# Patient Record
Sex: Female | Born: 1950 | Hispanic: No | Smoking: Never smoker
Health system: Southern US, Community
[De-identification: ages and names within clinical notes are randomized; demographics above are authoritative.]

---

## 2017-04-06 ENCOUNTER — Ambulatory Visit (INDEPENDENT_AMBULATORY_CARE_PROVIDER_SITE_OTHER): Payer: Self-pay | Admitting: Physician Assistant

## 2017-04-06 ENCOUNTER — Encounter (INDEPENDENT_AMBULATORY_CARE_PROVIDER_SITE_OTHER): Payer: Self-pay | Admitting: Physician Assistant

## 2017-04-06 VITALS — BP 132/73 | HR 74 | Temp 98.5°F | Ht <= 58 in | Wt 191.2 lb

## 2017-04-06 DIAGNOSIS — M5442 Lumbago with sciatica, left side: Secondary | ICD-10-CM

## 2017-04-06 DIAGNOSIS — R1013 Epigastric pain: Secondary | ICD-10-CM

## 2017-04-06 DIAGNOSIS — R7303 Prediabetes: Secondary | ICD-10-CM

## 2017-04-06 DIAGNOSIS — Z131 Encounter for screening for diabetes mellitus: Secondary | ICD-10-CM

## 2017-04-06 DIAGNOSIS — M5441 Lumbago with sciatica, right side: Secondary | ICD-10-CM

## 2017-04-06 DIAGNOSIS — G8929 Other chronic pain: Secondary | ICD-10-CM

## 2017-04-06 LAB — POCT GLYCOSYLATED HEMOGLOBIN (HGB A1C): HEMOGLOBIN A1C: 6.3

## 2017-04-06 MED ORDER — OMEPRAZOLE 40 MG PO CPDR: 40.0000 mg | DELAYED_RELEASE_CAPSULE | Freq: Every day | ORAL | 3 refills | Status: DC

## 2017-04-06 MED ORDER — NAPROXEN 500 MG PO TABS: 500.0000 mg | ORAL_TABLET | Freq: Two times a day (BID) | ORAL | 0 refills | Status: DC

## 2017-04-06 MED FILL — OMEPRAZOLE DR 40 MG CAPSULE: 40 | 30 days supply | Qty: 30 | Fill #0

## 2017-04-06 MED FILL — NAPROXEN 500 MG TABLET: 500 | 15 days supply | Qty: 30 | Fill #0

## 2017-04-06 NOTE — Progress Notes (Signed)
Subjective:  Patient ID: Shelley Parker, female    DOB: 12-Jan-1951  Age: 66 y.o. MRN: [id number]  CC: abdominal pain, joint pain, nausea  HPI Shelley Parker is a 66 y.o. female with no significant medical history presents as a new patient with complaint of abdominal pain, joint pain, and nausea. Epigastric pain and bloating since approximately one year ago. Medication was given in [place] that alleviated the pain but she does not know the name medication. Does not endorse melena, hematemesis, BRBPR, f/c/n/v, CP, palpitations, SOB, or HA.       Joint pain in the lower back, knees, hips, and elbows. Pain worse at the lower back. Back pain worse with prolonged sitting and rest. Pain radiates down both legs. Radiculopathy described as partial numbness with associated weakness.  Does not endorse injury, repetitive use, edema, erythema, rash, urinary incontinence, or fecal incontinence.   * Daughter serves as the interpreter for the visit.    ROS Review of Systems  Constitutional: Negative for chills, fever and malaise/fatigue.  Eyes: Negative for blurred vision.  Respiratory: Negative for shortness of breath.   Cardiovascular: Negative for chest pain, palpitations and leg swelling.  Gastrointestinal: Positive for abdominal pain, constipation and nausea. Negative for blood in stool, diarrhea and melena.  Genitourinary: Negative for dysuria and hematuria.  Musculoskeletal: Positive for joint pain. Negative for myalgias.  Skin: Negative for rash.  Neurological: Negative for tingling and headaches.  Psychiatric/Behavioral: Negative for depression. The patient is not nervous/anxious.     Objective:  BP 132/73 (BP Location: Right Arm, Patient Position: Sitting, Cuff Size: Large)   Pulse 74   Temp 98.5 F (36.9 C) (Oral)   Ht 4' 9.09" (1.45 m)   Wt 191 lb 3.2 oz (86.7 kg)   SpO2 99%   BMI 41.25 kg/m   BP/Weight 04/06/2017  Systolic BP 132  Diastolic BP 73  Wt. (Lbs) 191.2  BMI 41.25       Physical Exam  Constitutional: She is oriented to person, place, and time.  Well developed, overweight, NAD, polite  HENT:  Head: Normocephalic and atraumatic.  Eyes: Conjunctivae are normal. No scleral icterus.  Neck: Normal range of motion. Neck supple. No thyromegaly present.  Cardiovascular: Normal rate, regular rhythm and normal heart sounds.   Trace pitting edema of the LLE  Pulmonary/Chest: Effort normal and breath sounds normal. No respiratory distress. She has no wheezes.  Abdominal: Soft. Bowel sounds are normal. There is tenderness (mild epigastric TTP).  Musculoskeletal: She exhibits no edema.  Neurological: She is alert and oriented to person, place, and time. No cranial nerve deficit. Coordination normal.  Skin: Skin is warm and dry. No rash noted. No erythema. No pallor.  Psychiatric: She has a normal mood and affect. Her behavior is normal. Thought content normal.  Vitals reviewed.    Assessment & Plan:   1. Abdominal pain, chronic, epigastric - H. pylori antibody, IgG - Comprehensive metabolic panel - CBC with Differential - Begin omeprazole (PRILOSEC) 40 MG capsule; Take 1 capsule (40 mg total) by mouth daily.  Dispense: 30 capsule; Refill: 3  2. Chronic bilateral low back pain with bilateral sciatica - DG Lumbar Spine Complete; Future - Begin naproxen (NAPROSYN) 500 MG tablet; Take 1 tablet (500 mg total) by mouth 2 (two) times daily with a meal.  Dispense: 30 tablet; Refill: 0  3. Prediabetes - HgB A1c 6.3% in clinic today.  4. Screening for diabetes mellitus - HgB A1c 6.3% in clinic today.   Meds  ordered this encounter  Medications  . omeprazole (PRILOSEC) 40 MG capsule    Sig: Take 1 capsule (40 mg total) by mouth daily.    Dispense:  30 capsule    Refill:  3    Order Specific Question:   Supervising Provider    Answer:   Quentin Angst [id number]  . naproxen (NAPROSYN) 500 MG tablet    Sig: Take 1 tablet (500 mg total) by mouth 2  (two) times daily with a meal.    Dispense:  30 tablet    Refill:  0    Order Specific Question:   Supervising Provider    Answer:   Quentin Angst [id number]    Follow-up: Return in about 3 weeks (around 04/27/2017) for back pain.   Loletta Specter PA

## 2017-04-06 NOTE — Patient Instructions (Signed)
Prediabetes Eating Plan Prediabetes-also called impaired glucose tolerance or impaired fasting glucose-is a condition that causes blood sugar (blood glucose) levels to be higher than normal. Following a healthy diet can help to keep prediabetes under control. It can also help to lower the risk of type 2 diabetes and heart disease, which are increased in people who have prediabetes. Along with regular exercise, a healthy diet:  Promotes weight loss.  Helps to control blood sugar levels.  Helps to improve the way that the body uses insulin.  What do I need to know about this eating plan?  Use the glycemic index (GI) to plan your meals. The index tells you how quickly a food will raise your blood sugar. Choose low-GI foods. These foods take a longer time to raise blood sugar.  Pay close attention to the amount of carbohydrates in the food that you eat. Carbohydrates increase blood sugar levels.  Keep track of how many calories you take in. Eating the right amount of calories will help you to achieve a healthy weight. Losing about 7 percent of your starting weight can help to prevent type 2 diabetes.  You may want to follow a Mediterranean diet. This diet includes a lot of vegetables, lean meats or fish, whole grains, fruits, and healthy oils and fats. What foods can I eat? Grains Whole grains, such as whole-wheat or whole-grain breads, crackers, cereals, and pasta. Unsweetened oatmeal. Bulgur. Barley. Quinoa. Brown rice. Corn or whole-wheat flour tortillas or taco shells. Vegetables Lettuce. Spinach. Peas. Beets. Cauliflower. Cabbage. Broccoli. Carrots. Tomatoes. Squash. Eggplant. Herbs. Peppers. Onions. Cucumbers. Brussels sprouts. Fruits Berries. Bananas. Apples. Oranges. Grapes. Papaya. Mango. Pomegranate. Kiwi. Grapefruit. Cherries. Meats and Other Protein Sources Seafood. Lean meats, such as chicken and turkey or lean cuts of pork and beef. Tofu. Eggs. Nuts. Beans. Dairy Low-fat or  fat-free dairy products, such as yogurt, cottage cheese, and cheese. Beverages Water. Tea. Coffee. Sugar-free or diet soda. Seltzer water. Milk. Milk alternatives, such as soy or almond milk. Condiments Mustard. Relish. Low-fat, low-sugar ketchup. Low-fat, low-sugar barbecue sauce. Low-fat or fat-free mayonnaise. Sweets and Desserts Sugar-free or low-fat pudding. Sugar-free or low-fat ice cream and other frozen treats. Fats and Oils Avocado. Walnuts. Olive oil. The items listed above may not be a complete list of recommended foods or beverages. Contact your dietitian for more options. What foods are not recommended? Grains Refined white flour and flour products, such as bread, pasta, snack foods, and cereals. Beverages Sweetened drinks, such as sweet iced tea and soda. Sweets and Desserts Baked goods, such as cake, cupcakes, pastries, cookies, and cheesecake. The items listed above may not be a complete list of foods and beverages to avoid. Contact your dietitian for more information. This information is not intended to replace advice given to you by your health care provider. Make sure you discuss any questions you have with your health care provider. Document Released: 10/16/2014 Document Revised: 11/07/2015 Document Reviewed: 06/27/2014 Elsevier Interactive Patient Education  2017 Elsevier Inc.  

## 2017-04-07 ENCOUNTER — Other Ambulatory Visit (INDEPENDENT_AMBULATORY_CARE_PROVIDER_SITE_OTHER): Payer: Self-pay | Admitting: Physician Assistant

## 2017-04-07 ENCOUNTER — Telehealth: Payer: Self-pay

## 2017-04-07 DIAGNOSIS — A048 Other specified bacterial intestinal infections: Secondary | ICD-10-CM

## 2017-04-07 LAB — COMPREHENSIVE METABOLIC PANEL
A/G RATIO: 1.3 (ref 1.2–2.2)
ALBUMIN: 4.3 g/dL (ref 3.6–4.8)
ALK PHOS: 74 IU/L (ref 39–117)
ALT: 10 IU/L (ref 0–32)
AST: 18 IU/L (ref 0–40)
BUN / CREAT RATIO: 19 (ref 12–28)
BUN: 12 mg/dL (ref 8–27)
CHLORIDE: 103 mmol/L (ref 96–106)
CO2: 25 mmol/L (ref 20–29)
Calcium: 9.7 mg/dL (ref 8.7–10.3)
Creatinine, Ser: 0.63 mg/dL (ref 0.57–1.00)
GFR calc non Af Amer: 94 mL/min/{1.73_m2} (ref >59)
GFR, EST AFRICAN AMERICAN: 108 mL/min/{1.73_m2} (ref >59)
Globulin, Total: 3.2 g/dL (ref 1.5–4.5)
Glucose: 110 mg/dL — ABNORMAL HIGH (ref 65–99)
POTASSIUM: 4.2 mmol/L (ref 3.5–5.2)
Sodium: 142 mmol/L (ref 134–144)
TOTAL PROTEIN: 7.5 g/dL (ref 6.0–8.5)

## 2017-04-07 LAB — CBC WITH DIFFERENTIAL/PLATELET
BASOS: 1 %
Basophils Absolute: 0 10*3/uL (ref 0.0–0.2)
EOS (ABSOLUTE): 0.3 10*3/uL (ref 0.0–0.4)
Eos: 6 %
HEMATOCRIT: 38.3 % (ref 34.0–46.6)
Hemoglobin: 12.7 g/dL (ref 11.1–15.9)
IMMATURE GRANULOCYTES: 0 %
Immature Grans (Abs): 0 10*3/uL (ref 0.0–0.1)
LYMPHS ABS: 2.2 10*3/uL (ref 0.7–3.1)
Lymphs: 35 %
MCH: 28.9 pg (ref 26.6–33.0)
MCHC: 33.2 g/dL (ref 31.5–35.7)
MCV: 87 fL (ref 79–97)
MONOS ABS: 0.5 10*3/uL (ref 0.1–0.9)
Monocytes: 8 %
Neutrophils Absolute: 3.1 10*3/uL (ref 1.4–7.0)
Neutrophils: 50 %
PLATELETS: 377 10*3/uL (ref 150–379)
RBC: 4.4 x10E6/uL (ref 3.77–5.28)
RDW: 15 % (ref 12.3–15.4)
WBC: 6.1 10*3/uL (ref 3.4–10.8)

## 2017-04-07 LAB — H. PYLORI ANTIBODY, IGG: H. pylori, IgG AbS: 1.98 {index_val} — ABNORMAL HIGH (ref 0.00–0.79)

## 2017-04-07 MED ORDER — CLARITHROMYCIN 500 MG PO TABS: 500.0000 mg | ORAL_TABLET | Freq: Two times a day (BID) | ORAL | 0 refills | Status: AC

## 2017-04-07 MED ORDER — AMOXICILLIN 500 MG PO TABS: 1000.0000 mg | ORAL_TABLET | Freq: Two times a day (BID) | ORAL | 0 refills | Status: AC

## 2017-04-07 MED FILL — CLARITHROMYCIN 500 MG TAB: 500 | 14 days supply | Qty: 28 | Fill #0

## 2017-04-07 MED FILL — AMOXICILLIN 500 MG CAPSULE: 500 | 14 days supply | Qty: 56 | Fill #0

## 2017-04-07 NOTE — Telephone Encounter (Signed)
-----   Message from Loletta Specteroger David Gomez, PA-C sent at 04/07/2017  1:34 PM EDT ----- Exposed to stomach bacteria. I will send two antibiotics to [facility] to help get rid of the bacteria.

## 2017-04-07 NOTE — Telephone Encounter (Signed)
Patients daughter aware that patient has been exposed to stomach bacteria and Rx for abx have been sent to [facility], daughter will pick up and ensure patient takes medication as directed. Maryjean Mornempestt S Roberts, CMA

## 2017-04-27 ENCOUNTER — Encounter (INDEPENDENT_AMBULATORY_CARE_PROVIDER_SITE_OTHER): Payer: Self-pay | Admitting: Physician Assistant

## 2017-04-27 ENCOUNTER — Ambulatory Visit (HOSPITAL_COMMUNITY)
Admission: RE | Admit: 2017-04-27 | Discharge: 2017-04-27 | Disposition: A | Discharge status: Home / Self Care | Payer: Self-pay | Source: Ambulatory Visit | Attending: Physician Assistant | Admitting: Physician Assistant

## 2017-04-27 ENCOUNTER — Ambulatory Visit (INDEPENDENT_AMBULATORY_CARE_PROVIDER_SITE_OTHER): Payer: Self-pay | Admitting: Physician Assistant

## 2017-04-27 VITALS — BP 136/82 | HR 80 | Temp 97.8°F | Resp 18 | Ht <= 58 in | Wt 193.0 lb

## 2017-04-27 DIAGNOSIS — M25561 Pain in right knee: Secondary | ICD-10-CM | POA: Insufficient documentation

## 2017-04-27 DIAGNOSIS — M25562 Pain in left knee: Secondary | ICD-10-CM | POA: Insufficient documentation

## 2017-04-27 DIAGNOSIS — M47816 Spondylosis without myelopathy or radiculopathy, lumbar region: Secondary | ICD-10-CM | POA: Insufficient documentation

## 2017-04-27 DIAGNOSIS — M2578 Osteophyte, vertebrae: Secondary | ICD-10-CM | POA: Insufficient documentation

## 2017-04-27 DIAGNOSIS — R1013 Epigastric pain: Secondary | ICD-10-CM

## 2017-04-27 DIAGNOSIS — I700 Atherosclerosis of aorta: Secondary | ICD-10-CM | POA: Insufficient documentation

## 2017-04-27 DIAGNOSIS — M25511 Pain in right shoulder: Secondary | ICD-10-CM

## 2017-04-27 DIAGNOSIS — M170 Bilateral primary osteoarthritis of knee: Secondary | ICD-10-CM | POA: Insufficient documentation

## 2017-04-27 DIAGNOSIS — M5441 Lumbago with sciatica, right side: Secondary | ICD-10-CM

## 2017-04-27 DIAGNOSIS — M5442 Lumbago with sciatica, left side: Secondary | ICD-10-CM

## 2017-04-27 DIAGNOSIS — M545 Low back pain, unspecified: Secondary | ICD-10-CM

## 2017-04-27 DIAGNOSIS — G8929 Other chronic pain: Secondary | ICD-10-CM | POA: Insufficient documentation

## 2017-04-27 DIAGNOSIS — M542 Cervicalgia: Secondary | ICD-10-CM

## 2017-04-27 MED ORDER — CYCLOBENZAPRINE HCL 5 MG PO TABS: 5.0000 mg | ORAL_TABLET | Freq: Every day | ORAL | 0 refills | Status: DC

## 2017-04-27 MED ORDER — ACETAMINOPHEN-CODEINE #3 300-30 MG PO TABS: 1.0000 | ORAL_TABLET | Freq: Two times a day (BID) | ORAL | 0 refills | Status: DC | PRN

## 2017-04-27 MED FILL — ?CYCLOBENZAPRINE 5MG TABLET: 5 | 15 days supply | Qty: 15 | Fill #0

## 2017-04-27 MED FILL — ACETAMINOPHEN/COD #3 TABLET: 300-30 | 30 days supply | Qty: 60 | Fill #0

## 2017-04-27 NOTE — Progress Notes (Signed)
Subjective:  Patient ID: Shelley Parker, female    DOB: 05-13-1951  Age: 66 y.o. MRN: [id number]  CC: back pain  HPI Shelley Parker is a 66 y.o. female with a medical history of epigastric pain returns of f/u of epigastric pain and LBP.  Recent H pylori testing was positive and two antibiotics were sent out. Symptoms relieved with abx and omeprazole but still feels epigastric discomfort/pain.    Continues with LBP.  XR of L spine ordered but did not go to radiology yet. Plans to go today. No progression of pain since last visit. No red flag symptoms.    Right shoulder pain for six to seven months. Shoulder feels "heavy". Associated to right side neck pain. Does not endorse limited aROM, paralysis, cyanosis, tingling, or weakness.    Bilateral knee pain for one year. Right medial knee pain and left lateral knee pain. Worse with stair ascending/descending. Pain rated 8-9/10 with weight bearing. Better with rest and elevation. Naproxen use did not help with pain.     Outpatient Medications Prior to Visit  Medication Sig Dispense Refill  . naproxen (NAPROSYN) 500 MG tablet Take 1 tablet (500 mg total) by mouth 2 (two) times daily with a meal. 30 tablet 0  . omeprazole (PRILOSEC) 40 MG capsule Take 1 capsule (40 mg total) by mouth daily. 30 capsule 3   No facility-administered medications prior to visit.      ROS Review of Systems  Constitutional: Negative for chills, fever and malaise/fatigue.  Eyes: Negative for blurred vision.  Respiratory: Negative for shortness of breath.   Cardiovascular: Negative for chest pain and palpitations.  Gastrointestinal: Positive for abdominal pain and constipation. Negative for blood in stool, diarrhea, melena, nausea and vomiting.  Genitourinary: Negative for dysuria and hematuria.  Musculoskeletal: Positive for back pain, joint pain and neck pain. Negative for myalgias.  Skin: Negative for rash.  Neurological: Negative for tingling and headaches.   Psychiatric/Behavioral: Negative for depression. The patient is not nervous/anxious.     Objective:  BP 136/82 (BP Location: Right Arm, Patient Position: Sitting, Cuff Size: Large)   Pulse 80   Temp 97.8 F (36.6 C) (Oral)   Resp 18   Ht 4\' 8"  (1.422 m)   Wt 193 lb (87.5 kg)   SpO2 96%   BMI 43.27 kg/m   BP/Weight 04/27/2017 04/06/2017  Systolic BP 136 132  Diastolic BP 82 73  Wt. (Lbs) 193 191.2  BMI 43.27 41.25      Physical Exam  Constitutional: She is oriented to person, place, and time.  Well developed, overweight, NAD, polite  HENT:  Head: Normocephalic and atraumatic.  Eyes: No scleral icterus.  Neck: Normal range of motion.  Increased muscular tonicity of the upper trapezius bilaterally  Cardiovascular: Normal rate, regular rhythm and normal heart sounds.  Pulmonary/Chest: Effort normal and breath sounds normal. No respiratory distress. She has no wheezes.  Abdominal: Soft. Bowel sounds are normal. There is tenderness (mild epigastric TTP).  Musculoskeletal: She exhibits no edema.  Full aROM of the knee bilaterally, of the right shoulder, and of the neck. TTP along the medial aspect   Neurological: She is alert and oriented to person, place, and time. No cranial nerve deficit. Coordination normal.  Antalgic gait  Skin: Skin is warm and dry. No rash noted. No erythema. No pallor.  Psychiatric: She has a normal mood and affect. Her behavior is normal. Thought content normal.  Vitals reviewed.    Assessment & Plan:  1. Chronic midline low back pain without sciatica - Pt urged to have her lumbar spine x ray done - Begin Tylenol #3 BID, #60   2. Chronic pain of both knees - DG Knee Complete 4 Views Left; Future - DG Knee Complete 4 Views Right; Future - Begin Tylenol #3 BID, #60   3. Pain in joint of right shoulder - Begin Tylenol #3 BID, #60   4. Cervicalgia - Begin Cyclobenzaprine  5. Abdominal pain, chronic, epigastric - H pylori positive,  advised to finish her two antibiotics - Breath test at follow up visit.   Meds ordered this encounter  Medications  . acetaminophen-codeine (TYLENOL #3) 300-30 MG tablet    Sig: Take 1 tablet every 12 (twelve) hours as needed by mouth for moderate pain.    Dispense:  60 tablet    Refill:  0    Order Specific Question:   Supervising Provider    Answer:   Quentin AngstJEGEDE, OLUGBEMIGA E [id number]  . cyclobenzaprine (FLEXERIL) 5 MG tablet    Sig: Take 1 tablet (5 mg total) at bedtime by mouth.    Dispense:  15 tablet    Refill:  0    Order Specific Question:   Supervising Provider    Answer:   Quentin AngstJEGEDE, OLUGBEMIGA E [[id number]]    Follow-up: Return in about 4 weeks (around 05/25/2017) for multiple arthralgias and H pylori breath test.   Loletta Specteroger David Gomez PA

## 2017-04-27 NOTE — Patient Instructions (Signed)

## 2017-05-21 ENCOUNTER — Other Ambulatory Visit: Payer: Self-pay

## 2017-05-21 ENCOUNTER — Encounter (INDEPENDENT_AMBULATORY_CARE_PROVIDER_SITE_OTHER): Payer: Self-pay | Admitting: Physician Assistant

## 2017-05-21 ENCOUNTER — Ambulatory Visit (INDEPENDENT_AMBULATORY_CARE_PROVIDER_SITE_OTHER): Payer: Self-pay | Admitting: Physician Assistant

## 2017-05-21 VITALS — BP 116/71 | HR 76 | Temp 98.0°F | Wt 191.8 lb

## 2017-05-21 DIAGNOSIS — M5442 Lumbago with sciatica, left side: Secondary | ICD-10-CM

## 2017-05-21 DIAGNOSIS — Z1159 Encounter for screening for other viral diseases: Secondary | ICD-10-CM

## 2017-05-21 DIAGNOSIS — R1013 Epigastric pain: Secondary | ICD-10-CM

## 2017-05-21 DIAGNOSIS — Z1211 Encounter for screening for malignant neoplasm of colon: Secondary | ICD-10-CM

## 2017-05-21 DIAGNOSIS — K5900 Constipation, unspecified: Secondary | ICD-10-CM

## 2017-05-21 DIAGNOSIS — K3184 Gastroparesis: Secondary | ICD-10-CM

## 2017-05-21 DIAGNOSIS — M5441 Lumbago with sciatica, right side: Secondary | ICD-10-CM

## 2017-05-21 DIAGNOSIS — G8929 Other chronic pain: Secondary | ICD-10-CM

## 2017-05-21 MED ORDER — SENNOSIDES-DOCUSATE SODIUM 8.6-50 MG PO TABS: 1.0000 | ORAL_TABLET | Freq: Two times a day (BID) | ORAL | 0 refills | Status: AC

## 2017-05-21 MED ORDER — ACETAMINOPHEN 500 MG PO TABS: 1000.0000 mg | ORAL_TABLET | Freq: Three times a day (TID) | ORAL | 0 refills | Status: AC | PRN

## 2017-05-21 MED ORDER — NAPROXEN 500 MG PO TABS: 500.0000 mg | ORAL_TABLET | Freq: Two times a day (BID) | ORAL | 0 refills | Status: AC

## 2017-05-21 MED ORDER — OMEPRAZOLE 40 MG PO CPDR: 40.0000 mg | DELAYED_RELEASE_CAPSULE | Freq: Every day | ORAL | 3 refills | Status: AC

## 2017-05-21 MED ORDER — METOCLOPRAMIDE HCL 5 MG/5ML PO SOLN: 5.0000 mg | Freq: Three times a day (TID) | ORAL | 0 refills | Status: AC

## 2017-05-21 MED FILL — NAPROXEN 500 MG TABLET: 500 | 15 days supply | Qty: 30 | Fill #0

## 2017-05-21 MED FILL — METOCLOPRAMIDE 5 MG/5 ML SY: 5 | 5 days supply | Qty: 75 | Fill #0

## 2017-05-21 MED FILL — OMEPRAZOLE DR 40 MG CAPSULE: 40 | 30 days supply | Qty: 30 | Fill #0

## 2017-05-21 NOTE — Progress Notes (Signed)
Subjective:  Patient ID: Shelley Parker, female    DOB: 05/01/1951  Age: 66 y.o. MRN: [id number]  CC: arthralgias and h pylori  HPI   Shelley Parker is a 66 y.o. female with a medical history of epigastric pain returns of f/u of H pylori infection. Has finished her antibiotics and PPI. Feels much better but states there is still some epigastric discomfort with movement and with eating. Feels as though her food stays in the stomach for hours after her meal. There is also associated bloating and constipation. Had taken stool softener with some relief but has run out. She is now at four days without a bowel movement. A1c 6.3% one and a half months ago. Does not report hematochezia unless she has a hemorrhoid.     Also continues with lower back pain and bilateral knee pain. Radiographs were completed since last visit and revealed mild degenerative changes in the knees and back. She has taken naproxen, cyclobenzaprine, and Tylenol #3 with relief of symptoms. Says back pain in no longer generalized to the paraspinals and is now only felt midline. Knee pain also mildly reduced with NSAIDs.    Outpatient Medications Prior to Visit  Medication Sig Dispense Refill  . acetaminophen-codeine (TYLENOL #3) 300-30 MG tablet Take 1 tablet every 12 (twelve) hours as needed by mouth for moderate pain. (Patient not taking: Reported on 05/21/2017) 60 tablet 0  . cyclobenzaprine (FLEXERIL) 5 MG tablet Take 1 tablet (5 mg total) at bedtime by mouth. (Patient not taking: Reported on 05/21/2017) 15 tablet 0  . naproxen (NAPROSYN) 500 MG tablet Take 1 tablet (500 mg total) by mouth 2 (two) times daily with a meal. (Patient not taking: Reported on 05/21/2017) 30 tablet 0  . omeprazole (PRILOSEC) 40 MG capsule Take 1 capsule (40 mg total) by mouth daily. (Patient not taking: Reported on 05/21/2017) 30 capsule 3   No facility-administered medications prior to visit.      ROS Review of Systems  Constitutional: Negative for  chills, fever and malaise/fatigue.  Eyes: Negative for blurred vision.  Respiratory: Negative for shortness of breath.   Cardiovascular: Negative for chest pain and palpitations.  Gastrointestinal: Positive for abdominal pain and constipation. Negative for blood in stool, diarrhea, melena, nausea and vomiting.  Genitourinary: Negative for dysuria and hematuria.  Musculoskeletal: Positive for back pain and joint pain. Negative for myalgias.  Skin: Negative for rash.  Neurological: Negative for tingling and headaches.  Psychiatric/Behavioral: Negative for depression. The patient is not nervous/anxious.     Objective:  BP 116/71 (BP Location: Right Arm, Patient Position: Sitting, Cuff Size: Large)   Pulse 76   Temp 98 F (36.7 C) (Oral)   Wt 191 lb 12.8 oz (87 kg)   SpO2 95%   BMI 43.00 kg/m   BP/Weight 05/21/2017 04/27/2017 04/06/2017  Systolic BP 116 136 132  Diastolic BP 71 82 73  Wt. (Lbs) 191.8 193 191.2  BMI 43 43.27 41.25      Physical Exam  Constitutional: She is oriented to person, place, and time.  Well developed, overweight, NAD, polite  HENT:  Head: Normocephalic and atraumatic.  Eyes: Conjunctivae are normal. No scleral icterus.  Neck: Normal range of motion. Neck supple. No thyromegaly present.  Cardiovascular: Normal rate, regular rhythm and normal heart sounds.  No murmur heard. Pulmonary/Chest: Effort normal and breath sounds normal. No respiratory distress. She has no wheezes.  Abdominal: Soft. Bowel sounds are normal. There is no tenderness. There is no rebound and no  guarding.  Mild distention  Musculoskeletal: She exhibits no edema.  Neurological: She is alert and oriented to person, place, and time. No cranial nerve deficit. Coordination normal.  Skin: Skin is warm and dry. No rash noted. No erythema. No pallor.  Psychiatric: She has a normal mood and affect. Her behavior is normal. Thought content normal.  Vitals reviewed.    Assessment & Plan:     1. Epigastric pain - Refill omeprazole (PRILOSEC) 40 MG capsule; Take 1 capsule (40 mg total) by mouth daily.  Dispense: 30 capsule; Refill: 3 - H. pylori breath test for eradication. Has finished abx treatment.  2. Gastroparesis - Begin metoCLOPramide (REGLAN) 5 MG/5ML solution; Take 5 mLs (5 mg total) by mouth 3 (three) times daily before meals for 5 days.  Dispense: 75 mL; Refill: 0 - TSH  3. Constipation, unspecified constipation type - Begin senna-docusate (SENOKOT-S) 8.6-50 MG tablet; Take 1 tablet by mouth 2 (two) times daily.  Dispense: 10 tablet; Refill: 0 - TSH  4. Chronic bilateral low back pain with bilateral sciatica - Refill naproxen (NAPROSYN) 500 MG tablet; Take 1 tablet (500 mg total) by mouth 2 (two) times daily with a meal.  Dispense: 30 tablet; Refill: 0 - Refill acetaminophen (TYLENOL) 500 MG tablet; Take 2 tablets (1,000 mg total) by mouth every 8 (eight) hours as needed for up to 7 days.  Dispense: 21 tablet; Refill: 0  5. Need for hepatitis C screening test - Hepatitis c antibody (reflex)  6. Special screening for malignant neoplasms, colon - Fecal occult blood, imunochemical     Meds ordered this encounter  Medications  . metoCLOPramide (REGLAN) 5 MG/5ML solution    Sig: Take 5 mLs (5 mg total) by mouth 3 (three) times daily before meals for 5 days.    Dispense:  75 mL    Refill:  0    Order Specific Question:   Supervising Provider    Answer:   Quentin AngstJEGEDE, OLUGBEMIGA E [id number]  . senna-docusate (SENOKOT-S) 8.6-50 MG tablet    Sig: Take 1 tablet by mouth 2 (two) times daily.    Dispense:  10 tablet    Refill:  0    Order Specific Question:   Supervising Provider    Answer:   Quentin AngstJEGEDE, OLUGBEMIGA E [id number]  . naproxen (NAPROSYN) 500 MG tablet    Sig: Take 1 tablet (500 mg total) by mouth 2 (two) times daily with a meal.    Dispense:  30 tablet    Refill:  0    Order Specific Question:   Supervising Provider    Answer:   Quentin AngstJEGEDE, OLUGBEMIGA E  [id number]]  . omeprazole (PRILOSEC) 40 MG capsule    Sig: Take 1 capsule (40 mg total) by mouth daily.    Dispense:  30 capsule    Refill:  3    Order Specific Question:   Supervising Provider    Answer:   Quentin AngstJEGEDE, OLUGBEMIGA E [id number]  . acetaminophen (TYLENOL) 500 MG tablet    Sig: Take 2 tablets (1,000 mg total) by mouth every 8 (eight) hours as needed for up to 7 days.    Dispense:  21 tablet    Refill:  0    Order Specific Question:   Supervising Provider    Answer:   Quentin AngstJEGEDE, OLUGBEMIGA E [id number]    Follow-up: Return in about 4 weeks (around 06/18/2017) for constipation.   Loletta Specteroger David Shellye Zandi PA

## 2017-05-21 NOTE — Patient Instructions (Signed)
Gastroparesis °Gastroparesis, also called delayed gastric emptying, is a condition in which food takes longer than normal to empty from the stomach. The condition is usually long-lasting (chronic). °What are the causes? °This condition may be caused by: °· An endocrine disorder, such as hypothyroidism or diabetes. Diabetes is the most common cause of this condition. °· A nervous system disease, such as Parkinson disease or multiple sclerosis. °· Cancer, infection, or surgery of the stomach or vagus nerve. °· A connective tissue disorder, such as scleroderma. °· Certain medicines. ° °In most cases, the cause is not known. °What increases the risk? °This condition is more likely to develop in: °· People with certain disorders, including endocrine disorders, eating disorders, amyloidosis, and scleroderma. °· People with certain diseases, including Parkinson disease or multiple sclerosis. °· People with cancer or infection of the stomach or vagus nerve. °· People who have had surgery on the stomach or vagus nerve. °· People who take certain medicines. °· Women. ° °What are the signs or symptoms? °Symptoms of this condition include: °· An early feeling of fullness when eating. °· Nausea. °· Weight loss. °· Vomiting. °· Heartburn. °· Abdominal bloating. °· Inconsistent blood glucose levels. °· Lack of appetite. °· Acid from the stomach coming up into the esophagus (gastroesophageal reflux). °· Spasms of the stomach. ° °Symptoms may come and go. °How is this diagnosed? °This condition is diagnosed with tests, such as: °· Tests that check how long it takes food to move through the stomach and intestines. These tests include: °? Upper gastrointestinal (GI) series. In this test, X-rays of the intestines are taken after you drink a liquid. The liquid makes the intestines show up better on the X-rays. °? Gastric emptying scintigraphy. In this test, scans are taken after you eat food that contains a small amount of radioactive  material. °? Wireless capsule GI monitoring system. This test involves swallowing a capsule that records information about movement through the stomach. °· Gastric manometry. This test measures electrical and muscular activity in the stomach. It is done with a thin tube that is passed down the throat and into the stomach. °· Endoscopy. This test checks for abnormalities in the lining of the stomach. It is done with a long, thin tube that is passed down the throat and into the stomach. °· An ultrasound. This test can help rule out gallbladder disease or pancreatitis as a cause of your symptoms. It uses sound waves to take pictures of the inside of your body. ° °How is this treated? °There is no cure for gastroparesis. This condition may be managed with: °· Treatment of the underlying condition causing the gastroparesis. °· Lifestyle changes, including exercise and dietary changes. Dietary changes can include: °? Changes in what and when you eat. °? Eating smaller meals more often. °? Eating low-fat foods. °? Eating low-fiber forms of high-fiber foods, such as cooked vegetables instead of raw vegetables. °? Having liquid foods in place of solid foods. Liquid foods are easier to digest. °· Medicines. These may be given to control nausea and vomiting and to stimulate stomach muscles. °· Getting food through a feeding tube. This may be done in severe cases. °· A gastric neurostimulator. This is a device that is inserted into the body with surgery. It helps improve stomach emptying and control nausea and vomiting. ° °Follow these instructions at home: °· Follow your health care provider's instructions about exercise and diet. °· Take medicines only as directed by your health care provider. °Contact a   health care provider if: °· Your symptoms do not improve with treatment. °· You have new symptoms. °Get help right away if: °· You have severe abdominal pain that does not improve with treatment. °· You have nausea that does  not go away. °· You cannot keep fluids down. °This information is not intended to replace advice given to you by your health care provider. Make sure you discuss any questions you have with your health care provider. °Document Released: 06/01/2005 Document Revised: 11/07/2015 Document Reviewed: 05/28/2014 °Elsevier Interactive Patient Education © 2018 [employer] Inc[employer] ° °

## 2017-05-22 LAB — HEPATITIS C ANTIBODY (REFLEX)

## 2017-05-22 LAB — HCV COMMENT:

## 2017-05-22 LAB — TSH: TSH: 0.769 u[IU]/mL (ref 0.450–4.500)

## 2017-05-23 LAB — H. PYLORI BREATH TEST: H pylori Breath Test: NEGATIVE

## 2017-05-26 ENCOUNTER — Ambulatory Visit (INDEPENDENT_AMBULATORY_CARE_PROVIDER_SITE_OTHER): Payer: Self-pay | Admitting: Physician Assistant

## 2017-05-27 ENCOUNTER — Encounter (INDEPENDENT_AMBULATORY_CARE_PROVIDER_SITE_OTHER): Payer: Self-pay

## 2017-05-27 ENCOUNTER — Telehealth (INDEPENDENT_AMBULATORY_CARE_PROVIDER_SITE_OTHER): Payer: Self-pay

## 2017-05-27 LAB — FECAL OCCULT BLOOD, IMMUNOCHEMICAL: Fecal Occult Bld: NEGATIVE

## 2017-05-27 NOTE — Telephone Encounter (Signed)
Using [employer] Vickie [phone]) number was disconnected or not in service, will mail results to patient. Maryjean Mornempestt S Roberts, CMA

## 2017-05-27 NOTE — Telephone Encounter (Signed)
-----   Message from Loletta Specteroger David Gomez, PA-C sent at 05/26/2017 12:28 PM EST ----- All labs are negative. Make an appointment if symptomatic or wants to discuss labs.

## 2019-09-12 IMAGING — DX DG KNEE COMPLETE 4+V*R*
4 series · 4 of 4 positions shown · non-contrast
Comparison: None.

CLINICAL DATA: Chronic knee pain

EXAM:
RIGHT KNEE - COMPLETE 4+ VIEW

[knee ap]
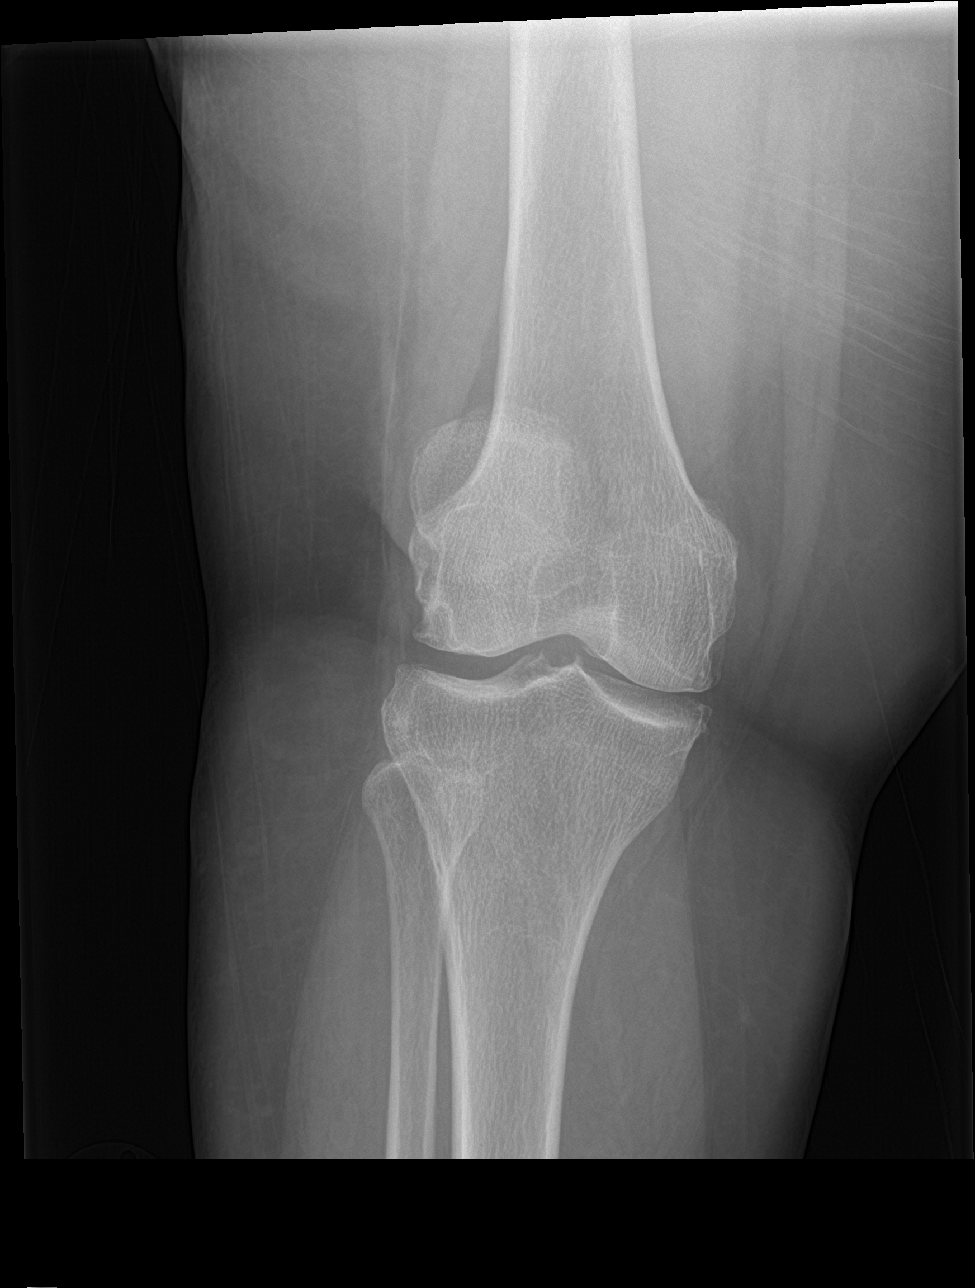

[knee lat]
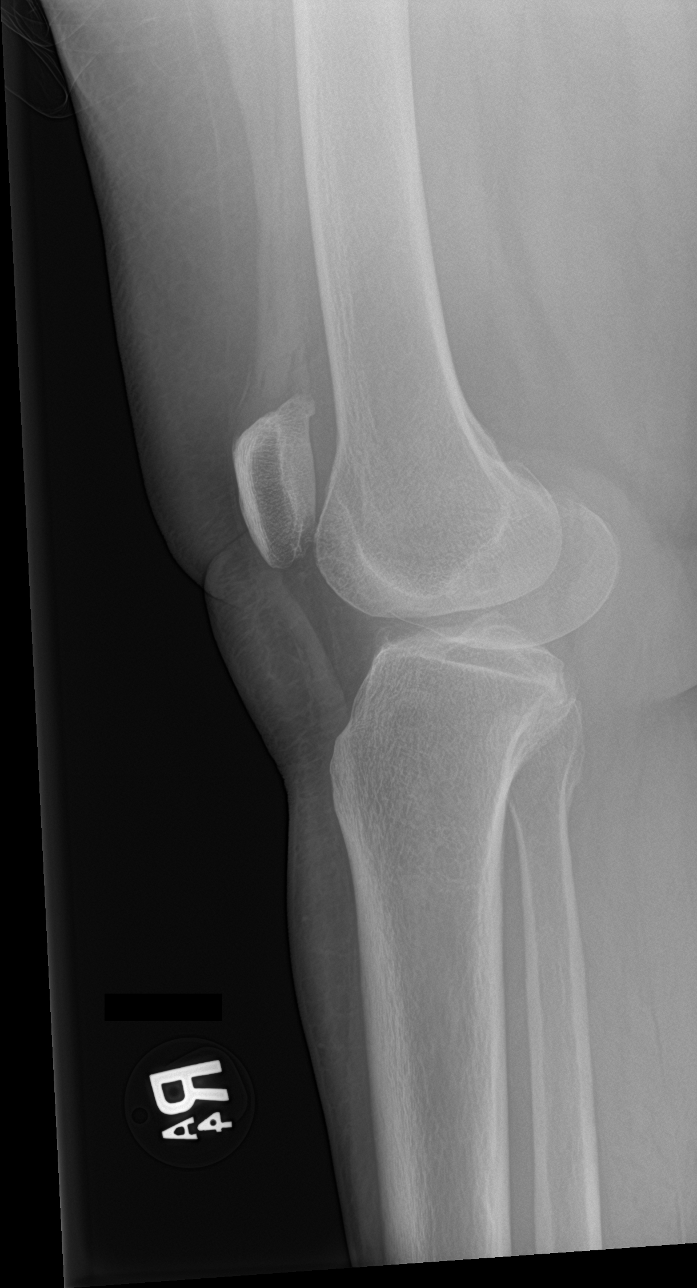

[knee obl (1 of 2)]
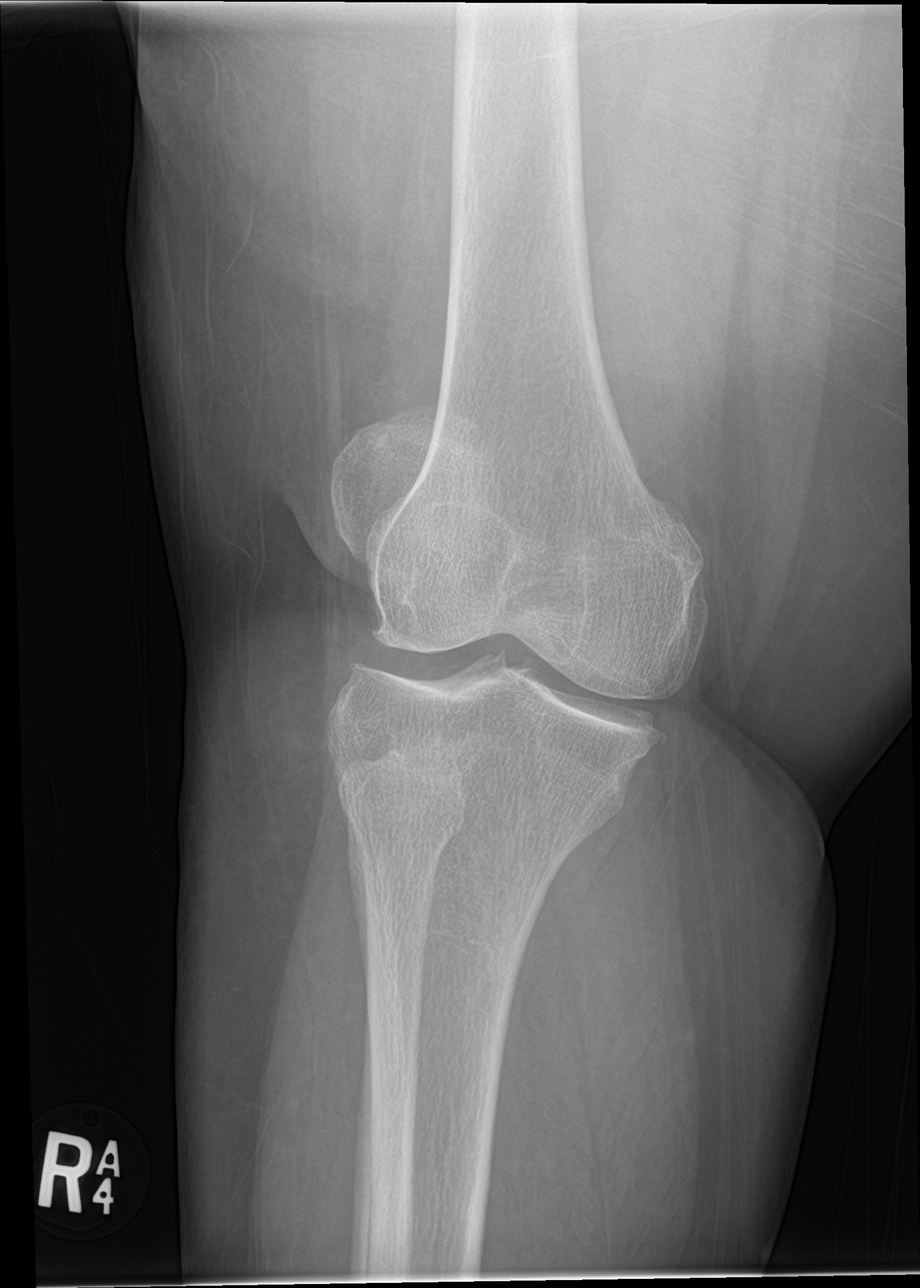

[knee obl (2 of 2)]
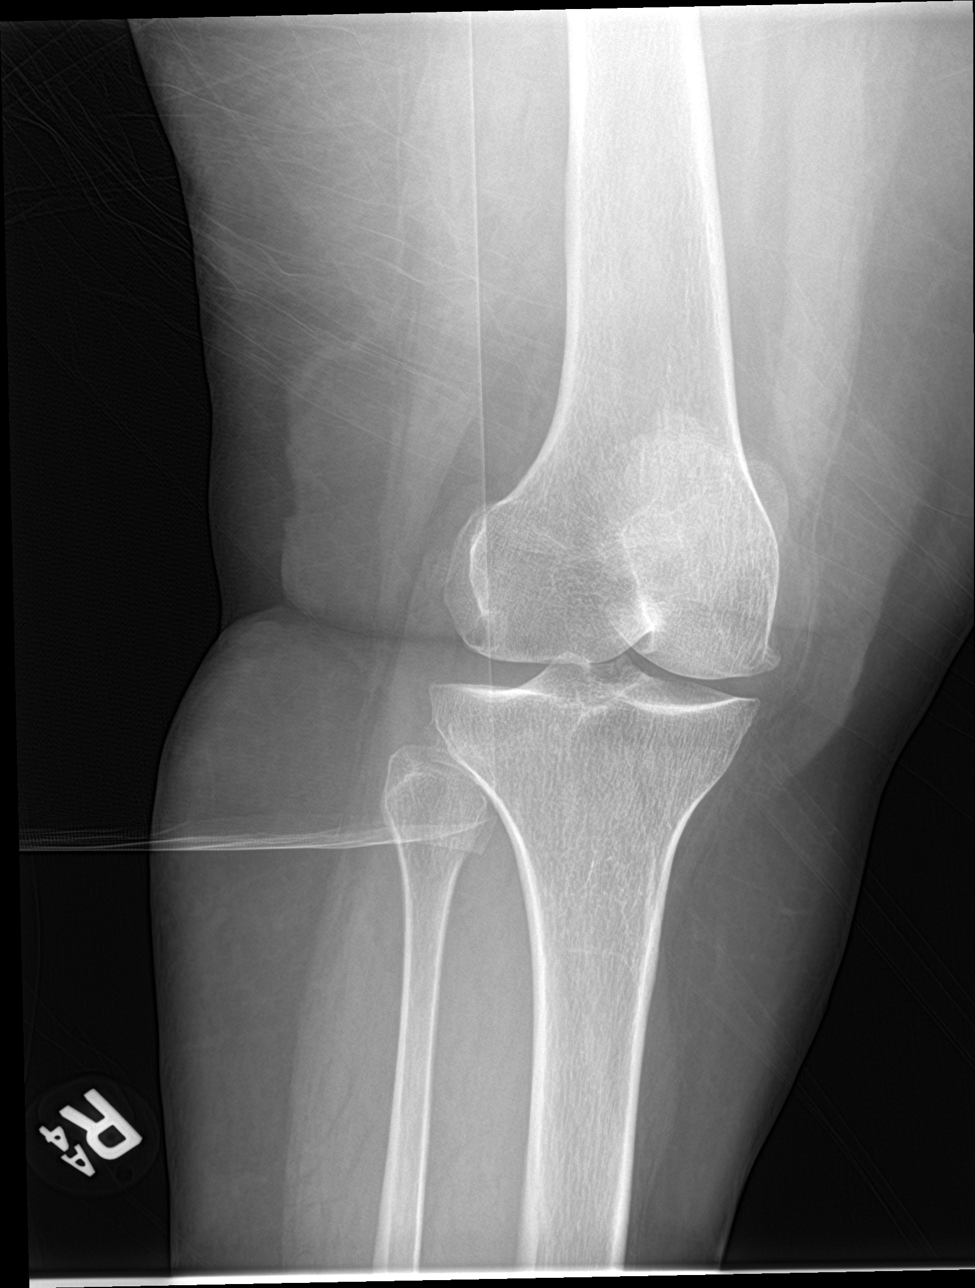

[4 of 4 positions shown; findings below may reference images not displayed]

FINDINGS: No acute fracture. Mild lateral deviation of the patella. Mild
patellofemoral, medial and lateral degenerative changes with
spurring present. No large effusion.
IMPRESSION: 1. No acute osseous abnormality
2. Mild degenerative changes. Mild lateral deviation of the patella.
